# Patient Record
Sex: Male | Born: 1995 | Race: White | Hispanic: No | Marital: Single | Smoking: Never smoker
Health system: Southern US, Community
[De-identification: ages and names within clinical notes are randomized; demographics above are authoritative.]

---

## 2020-02-27 ENCOUNTER — Other Ambulatory Visit: Payer: Self-pay

## 2020-02-27 ENCOUNTER — Emergency Department
Admission: EM | Admit: 2020-02-27 | Discharge: 2020-02-27 | Disposition: A | Discharge status: Home / Self Care | Payer: Self-pay | Source: Home / Self Care | Attending: Family Medicine | Admitting: Family Medicine

## 2020-02-27 DIAGNOSIS — R591 Generalized enlarged lymph nodes: Secondary | ICD-10-CM

## 2020-02-27 DIAGNOSIS — R5383 Other fatigue: Secondary | ICD-10-CM

## 2020-02-27 LAB — POCT MONO SCREEN (KUC): Mono, POC: NEGATIVE

## 2020-02-27 NOTE — Discharge Instructions (Addendum)
Recommend a daily multiple vitamin, including vitamin D.  Try taking daily nutritional supplement such as Ensure

## 2020-02-27 NOTE — ED Provider Notes (Signed)
[facility]    CSN: [id number] Arrival date & time: 02/27/20  1253      History   Chief Complaint Chief Complaint  Patient presents with  . Fatigue    HPI Spencer Moon is a 24 y.o. male.   Patient reports that he had extensive dental work earlier this year, finishing his last antibiotics in February. Three weeks ago he noticed a tender lymph node on his left neck, followed by bilateral tender axillary nodes and inguinal nodes.  He has been persistently fatigued with headache, myalgias, and night sweats.  He has had occasional nausea/vomiting for two weeks and anorexia resulting in weight loss.  He complains of vague lower back ache without urinary symptoms and mild non-productive cough for one month.  The history is provided by the patient.    History reviewed. No pertinent past medical history.  There are no problems to display for this patient.   History reviewed. No pertinent surgical history.     Home Medications    Prior to Admission medications   Not on File    Family History Family History  Problem Relation Age of Onset  . Diabetes Father     Social History Social History   Tobacco Use  . Smoking status: Never Smoker  . Smokeless tobacco: Never Used  Substance Use Topics  . Alcohol use: Not Currently  . Drug use: Yes    Types: Marijuana     Allergies   Patient has no known allergies.   Review of Systems Review of Systems  Constitutional: Positive for activity change, appetite change, chills, diaphoresis, fatigue and unexpected weight change. Negative for fever.  HENT: Negative.   Eyes: Negative.   Respiratory: Positive for cough. Negative for chest tightness, shortness of breath and wheezing.   Cardiovascular: Negative.   Gastrointestinal: Positive for nausea and vomiting. Negative for abdominal distention, abdominal pain, blood in stool, constipation and diarrhea.  Genitourinary: Negative.   Musculoskeletal: Positive for  back pain. Negative for myalgias and neck pain.  Skin: Negative.   Neurological: Negative.   Hematological: Positive for adenopathy.     Physical Exam Triage Vital Signs ED Triage Vitals  Enc Vitals Group     BP 02/27/20 1316 115/82     Pulse Rate 02/27/20 1316 97     Resp 02/27/20 1316 16     Temp 02/27/20 1316 98.1 F (36.7 C)     Temp Source 02/27/20 1316 Oral     SpO2 02/27/20 1316 100 %     Weight --      Height --      Head Circumference --      Peak Flow --      Pain Score 02/27/20 1314 3     Pain Loc --      Pain Edu? --      Excl. in [place]? --    No data found.  Updated Vital Signs BP 115/82 (BP Location: Left Arm)   Pulse 97   Temp 98.1 F (36.7 C) (Oral)   Resp 16   SpO2 100%   Visual Acuity Right Eye Distance:   Left Eye Distance:   Bilateral Distance:    Right Eye Near:   Left Eye Near:    Bilateral Near:     Physical Exam Nursing notes and Vital Signs reviewed. Appearance:  Patient appears stated age, somewhat cachectic, and in no acute distress Eyes:  Pupils are equal, round, and reactive to light and accomodation.  Extraocular  movement is intact.  Conjunctivae are not inflamed  Ears:  Canals normal.  Tympanic membranes normal.  Nose:   Normal turbinates.  No sinus tenderness.   Pharynx:  Normal Mouth:  Tongue normal.  No lesions.  Evidence of recent dental repairs Neck:  Supple.  Bilateral tender shotty cervical nodes.  Bilateral tender supraclavicular nodes.  Lungs:  Clear to auscultation.  Breath sounds are equal.  Moving air well. Heart:  Regular rate and rhythm without murmurs, rubs, or gallops.  Abdomen:  Tenderness over spleen and mild tenderness over liver without masses or hepatosplenomegaly.  Bowel sounds are present.  No CVA or flank tenderness.  Extremities:  No edema.  Skin:  No rash present.  Lymph nodes:  Tender shotty inguinal nodes.  UC Treatments / Results  Labs (all labs ordered are listed, but only abnormal results are  displayed) Labs Reviewed  POCT MONO SCREEN [facility]) negative    EKG   Radiology No results found.  Procedures Procedures (including critical care time)  Medications Ordered in UC Medications - No data to display  Initial Impression / Assessment and Plan / UC Course  I have reviewed the triage vital signs and the nursing notes.  Pertinent labs & imaging results that were available during my care of the patient were reviewed by me and considered in my medical decision making (see chart for details).    Despite negative Monospot test, suspect EBV.  Patient prefers to defer other testing for now because he is self-pay. Treat symptomatically; recommend follow-up with PCP if not improving in about 3 weeks   Final Clinical Impressions(s) / UC Diagnoses   Final diagnoses:  Fatigue, unspecified type  Lymphadenopathy     Discharge Instructions     Recommend a daily multiple vitamin, including vitamin D.  Try taking daily nutritional supplement such as Ensure    ED Prescriptions    None        Kandra Nicolas, MD 02/29/20 1302

## 2020-02-27 NOTE — ED Triage Notes (Signed)
Patient presents to Urgent Care with complaints of generalized fatigue and body aches as well as a nonproductive cough since a month ago. Patient reports he has not been around other sick contacts, thinks he may have been exposed to mono at the dentist.

## 2020-03-05 ENCOUNTER — Other Ambulatory Visit: Payer: Self-pay

## 2020-03-05 ENCOUNTER — Emergency Department
Admission: EM | Admit: 2020-03-05 | Discharge: 2020-03-05 | Disposition: A | Discharge status: Home / Self Care | Payer: Self-pay | Source: Home / Self Care

## 2020-03-05 ENCOUNTER — Encounter: Payer: Self-pay | Admitting: *Deleted

## 2020-03-05 DIAGNOSIS — R591 Generalized enlarged lymph nodes: Secondary | ICD-10-CM

## 2020-03-05 DIAGNOSIS — M79602 Pain in left arm: Secondary | ICD-10-CM

## 2020-03-05 DIAGNOSIS — R5383 Other fatigue: Secondary | ICD-10-CM

## 2020-03-05 MED ORDER — PREDNISONE 50 MG PO TABS: 50.0000 mg | ORAL_TABLET | Freq: Every day | ORAL | 0 refills | Status: AC

## 2020-03-05 NOTE — ED Provider Notes (Signed)
[facility]    CSN: [id number] Arrival date & time: 03/05/20  0851      History   Chief Complaint Chief Complaint  Patient presents with  . Arm Pain    left    HPI Temitayo Haywood is a 24 y.o. male.   HPI Trystian Fant is a 24 y.o. male presenting to UC with c/o Left arm pain and lymphadenopathy. Pt was seen at [facility] 1 week ago for suspected mono. He had a negative rapid mono test but opted out of additional testing due to financial concerns and fear of needles.  Two days ago he developed Left arm soreness after he slept on it extended out "cutting off circulation."  Pain is burning at times, tender and sore, associated swollen axillary tender lymph nodes. He also reports fatigue. He has taken ibuprofen with mild temporary relief. His girlfriend has also started to develop similar symptoms. He thinks she may have mono too but she has not been tested.  He has not received the covid vaccine but his girlfriend received her first dose. Pt denies fever, chills, n/v/d. No cough or congestion.   Pt also reports two small itchy bumps on his chest that developed 2 days ago but he believes that is from acne.    History reviewed. No pertinent past medical history.  There are no problems to display for this patient.   History reviewed. No pertinent surgical history.     Home Medications    Prior to Admission medications   Medication Sig Start Date End Date Taking? Authorizing Provider  predniSONE (DELTASONE) 50 MG tablet Take 1 tablet (50 mg total) by mouth daily with breakfast for 5 days. 03/05/20 03/10/20  Lurene Shadow, PA-C    Family History Family History  Problem Relation Age of Onset  . Diabetes Father     Social History Social History   Tobacco Use  . Smoking status: Never Smoker  . Smokeless tobacco: Never Used  Substance Use Topics  . Alcohol use: Not Currently  . Drug use: Yes    Types: Marijuana     Allergies   Patient has no known  allergies.   Review of Systems Review of Systems  Constitutional: Positive for fatigue. Negative for chills and fever.  HENT: Negative for congestion, ear pain, sore throat, trouble swallowing and voice change.   Respiratory: Negative for cough and shortness of breath.   Cardiovascular: Negative for chest pain and palpitations.  Gastrointestinal: Negative for abdominal pain, diarrhea, nausea and vomiting.  Musculoskeletal: Positive for arthralgias, myalgias and neck pain (lymph nodes). Negative for back pain and neck stiffness.  Skin: Negative for rash.  Neurological: Positive for headaches. Negative for dizziness and light-headedness.  All other systems reviewed and are negative.    Physical Exam Triage Vital Signs ED Triage Vitals  Enc Vitals Group     BP 03/05/20 0902 138/81     Pulse Rate 03/05/20 0902 88     Resp 03/05/20 0902 16     Temp 03/05/20 0902 98 F (36.7 C)     Temp Source 03/05/20 0902 Oral     SpO2 03/05/20 0902 99 %     Weight 03/05/20 0903 132 lb (59.9 kg)     Height 03/05/20 0903 6' (1.829 m)     Head Circumference --      Peak Flow --      Pain Score 03/05/20 0903 4     Pain Loc --  Pain Edu? --      Excl. in [place]? --    No data found.  Updated Vital Signs BP 138/81 (BP Location: Right Arm)   Pulse 88   Temp 98 F (36.7 C) (Oral)   Resp 16   Ht 6' (1.829 m)   Wt 132 lb (59.9 kg)   SpO2 99%   BMI 17.90 kg/m   Visual Acuity Right Eye Distance:   Left Eye Distance:   Bilateral Distance:    Right Eye Near:   Left Eye Near:    Bilateral Near:     Physical Exam Vitals and nursing note reviewed.  Constitutional:      General: He is not in acute distress.    Appearance: Normal appearance. He is well-developed. He is not ill-appearing, toxic-appearing or diaphoretic.  HENT:     Head: Normocephalic and atraumatic.     Right Ear: Tympanic membrane and ear canal normal.     Left Ear: Tympanic membrane and ear canal normal.     Nose: Nose  normal.     Right Sinus: No maxillary sinus tenderness or frontal sinus tenderness.     Left Sinus: No maxillary sinus tenderness or frontal sinus tenderness.     Mouth/Throat:     Lips: Pink.     Mouth: Mucous membranes are moist.     Pharynx: Oropharynx is clear. Uvula midline.  Neck:     Vascular: No carotid bruit.  Cardiovascular:     Rate and Rhythm: Normal rate and regular rhythm.  Pulmonary:     Effort: Pulmonary effort is normal. No respiratory distress.     Breath sounds: Normal breath sounds. No stridor. No wheezing, rhonchi or rales.  Musculoskeletal:        General: Tenderness present. Normal range of motion.     Cervical back: Normal range of motion and neck supple. No rigidity or tenderness.     Comments: Left arm: full ROM, mild tenderness to upper arm. No bony tenderness. Tenderness in Left axilla, palpable lymph nodes.  Lymphadenopathy:     Cervical: Cervical adenopathy present.  Skin:    General: Skin is warm and dry.  Neurological:     Mental Status: He is alert and oriented to person, place, and time.  Psychiatric:        Behavior: Behavior normal.      UC Treatments / Results  Labs (all labs ordered are listed, but only abnormal results are displayed) Labs Reviewed - No data to display  EKG   Radiology No results found.  Procedures Procedures (including critical care time)  Medications Ordered in UC Medications - No data to display  Initial Impression / Assessment and Plan / UC Course  I have reviewed the triage vital signs and the nursing notes.  Pertinent labs & imaging results that were available during my care of the patient were reviewed by me and considered in my medical decision making (see chart for details).    Reviewed prior visit from 1 week ago Discussed getting blood work today Pt declined again due to fear of needles and financial concern.  Pt states he is trying to get his medicaid card so he can establish with a PCP Will  have pt try trial of prednisone to help with pain and swelling Encouraged fluids and rest Resource guide and AVS provided.  Final Clinical Impressions(s) / UC Diagnoses   Final diagnoses:  Left arm pain  Lymphadenopathy  Fatigue, unspecified type  Discharge Instructions      You may take 500mg  acetaminophen every 4-6 hours or in combination with ibuprofen 400-600mg  every 6-8 hours as needed for pain, inflammation, and fever.  Be sure to well hydrated with clear liquids and get at least 8 hours of sleep at night, preferably more while sick.   Please follow up with family medicine in 1 week if needed.     ED Prescriptions    Medication Sig Dispense Auth. Provider   predniSONE (DELTASONE) 50 MG tablet Take 1 tablet (50 mg total) by mouth daily with breakfast for 5 days. 5 tablet , PA-C     PDMP not reviewed this encounter.   Lurene Shadow, PA-C 03/05/20 1018

## 2020-03-05 NOTE — ED Triage Notes (Signed)
Patient was seen 1 week ago and presumed to have mono. 2 days ago started having left arm pain. Believes he slept where he had cut off the circulation. C/o burning, tenderness and soreness still. C/o small red itching bumps on chest x 2 days.

## 2020-03-05 NOTE — Discharge Instructions (Signed)
  You may take 500mg acetaminophen every 4-6 hours or in combination with ibuprofen 400-600mg every 6-8 hours as needed for pain, inflammation, and fever.  Be sure to well hydrated with clear liquids and get at least 8 hours of sleep at night, preferably more while sick.   Please follow up with family medicine in 1 week if needed.   

## 2020-03-15 ENCOUNTER — Emergency Department (INDEPENDENT_AMBULATORY_CARE_PROVIDER_SITE_OTHER): Payer: Self-pay

## 2020-03-15 ENCOUNTER — Other Ambulatory Visit: Payer: Self-pay

## 2020-03-15 ENCOUNTER — Emergency Department
Admission: EM | Admit: 2020-03-15 | Discharge: 2020-03-15 | Disposition: A | Discharge status: Home / Self Care | Payer: Self-pay | Source: Home / Self Care

## 2020-03-15 ENCOUNTER — Encounter: Payer: Self-pay | Admitting: Emergency Medicine

## 2020-03-15 DIAGNOSIS — R5383 Other fatigue: Secondary | ICD-10-CM

## 2020-03-15 DIAGNOSIS — R079 Chest pain, unspecified: Secondary | ICD-10-CM

## 2020-03-15 DIAGNOSIS — R590 Localized enlarged lymph nodes: Secondary | ICD-10-CM

## 2020-03-15 NOTE — ED Triage Notes (Signed)
Patient returns for lump on left side of neck that has not resolved; pain in left axilla; and variety of concerns including wondering if he has cancer. Very flat affect as he relates numerous problems. Feels he had covid 12 months ago but it was not diagnosed; many problems since that time.

## 2020-03-15 NOTE — ED Provider Notes (Signed)
[facility]    CSN: [id number] Arrival date & time: 03/15/20  1145      History   Chief Complaint Chief Complaint  Patient presents with  . Mass    HPI Spencer Moon is a 24 y.o. male.   HPI Spencer Moon is a 24 y.o. male presenting to UC with c/o continued enlarged lymph nodes in his neck and Left axilla. Associated pain and fatigue.  He has still not been able to find a PCP due to lack of insurance.  [facility] with similar symptoms. A MonoSpot was performed, it was negative. He was seen again on 03/05/20, additional testing was offered, pt declined due to fear of needles. He reports vomiting after having the monospot performed in the UC. He also reports becoming combative in the past when he has had blood drawn due to a traumatic IV insertion when he was admitted for an abdominal exam for acid reflux.   Pt is hoping to get a CXR to see if that can give any information about his pain and enlarged lymph nodes.  Denies fever, chills, n/v/d.    History reviewed. No pertinent past medical history.  There are no problems to display for this patient.   History reviewed. No pertinent surgical history.     Home Medications    Prior to Admission medications   Not on File    Family History Family History  Problem Relation Age of Onset  . Diabetes Father     Social History Social History   Tobacco Use  . Smoking status: Never Smoker  . Smokeless tobacco: Never Used  Substance Use Topics  . Alcohol use: Not Currently  . Drug use: Yes    Types: Marijuana     Allergies   Patient has no known allergies.   Review of Systems Review of Systems  Constitutional: Positive for fatigue. Negative for chills and fever.  HENT: Negative for congestion, ear pain, sore throat, trouble swallowing and voice change.   Respiratory: Negative for cough and shortness of breath.   Cardiovascular: Negative for chest pain and palpitations.    Gastrointestinal: Negative for abdominal pain, diarrhea, nausea and vomiting.  Musculoskeletal: Positive for arthralgias and myalgias. Negative for back pain.  Skin: Negative for color change and rash.  All other systems reviewed and are negative.    Physical Exam Triage Vital Signs ED Triage Vitals  Enc Vitals Group     BP 03/15/20 1213 123/89     Pulse Rate 03/15/20 1213 (!) 104     Resp 03/15/20 1213 16     Temp 03/15/20 1213 98.2 F (36.8 C)     Temp Source 03/15/20 1213 Oral     SpO2 03/15/20 1213 100 %     Weight --      Height 03/15/20 1215 6' (1.829 m)     Head Circumference --      Peak Flow --      Pain Score 03/15/20 1215 2     Pain Loc --      Pain Edu? --      Excl. in GC? --    No data found.  Updated Vital Signs BP 123/89   Pulse (!) 104   Temp 98.2 F (36.8 C) (Oral)   Resp 16   Ht 6' (1.829 m)   SpO2 100%   BMI 17.90 kg/m   Visual Acuity Right Eye Distance:   Left Eye Distance:  Bilateral Distance:    Right Eye Near:   Left Eye Near:    Bilateral Near:     Physical Exam Vitals and nursing note reviewed.  Constitutional:      General: He is not in acute distress.    Appearance: Normal appearance. He is well-developed. He is not ill-appearing, toxic-appearing or diaphoretic.  HENT:     Head: Normocephalic and atraumatic.     Right Ear: Tympanic membrane and ear canal normal.     Left Ear: Tympanic membrane and ear canal normal.     Nose: Nose normal.     Right Sinus: No maxillary sinus tenderness or frontal sinus tenderness.     Left Sinus: No maxillary sinus tenderness or frontal sinus tenderness.     Mouth/Throat:     Lips: Pink.     Mouth: Mucous membranes are moist.     Pharynx: Oropharynx is clear. Uvula midline.  Eyes:     Extraocular Movements: Extraocular movements intact.  Neck:      Comments: Left cervical and supraclavicular lymphadenopathy  Cardiovascular:     Rate and Rhythm: Normal rate and regular rhythm.   Pulmonary:     Effort: Pulmonary effort is normal. No respiratory distress.     Breath sounds: Normal breath sounds. No stridor. No wheezing, rhonchi or rales.  Chest:     Chest wall: No tenderness.  Musculoskeletal:        General: Normal range of motion.     Cervical back: Normal range of motion and neck supple.  Lymphadenopathy:     Cervical: Cervical adenopathy present.  Skin:    General: Skin is warm and dry.  Neurological:     Mental Status: He is alert and oriented to person, place, and time.  Psychiatric:        Behavior: Behavior normal.      UC Treatments / Results  Labs (all labs ordered are listed, but only abnormal results are displayed) Labs Reviewed - No data to display  EKG   Radiology DG Chest 2 View  Result Date: 03/15/2020 CLINICAL DATA:  LEFT axillary lymphadenopathy and LEFT subclavicular lymphadenopathy. Chest, back and axillary pain for the past month, progressively worsening. No injury. EXAM: CHEST - 2 VIEW COMPARISON:  None. FINDINGS: Heart size and mediastinal contours are within normal limits. Lungs are clear. No pleural effusion or pneumothorax is seen. Osseous structures about the chest are unremarkable. IMPRESSION: Normal chest x-ray. Electronically Signed   By: Bary Richard M.D.   On: 03/15/2020 13:05    Procedures Procedures (including critical care time)  Medications Ordered in UC Medications - No data to display  Initial Impression / Assessment and Plan / UC Course  I have reviewed the triage vital signs and the nursing notes.  Pertinent labs & imaging results that were available during my care of the patient were reviewed by me and considered in my medical decision making (see chart for details).     Discussed CXR with pt Strongly encouraged to allow blood work be taken today to help determine cause of his symptoms.  After lengthy discussion, pt declined. He plans to use resource guide to help find a PCP and hopes to find one  that can sedate him for lab work to be drawn.  Recruitment consultant provided pt  AVS provided  Final Clinical Impressions(s) / UC Diagnoses   Final diagnoses:  Supraclavicular lymphadenopathy  Fatigue, unspecified type     Discharge Instructions  Emergency Department Resource Guide 1) Find a Doctor and Pay Out of Pocket Although you won't have to find out who is covered by your insurance plan, it is a good idea to ask around and get recommendations. You will then need to call the office and see if the doctor you have chosen will accept you as a new patient and what types of options they offer for patients who are self-pay. Some doctors offer discounts or will set up payment plans for their patients who do not have insurance, but you will need to ask so you aren't surprised when you get to your appointment.  2) Contact Your Local Health Department Not all health departments have doctors that can see patients for sick visits, but many do, so it is worth a call to see if yours does. If you don't know where your local health department is, you can check in your phone book. The CDC also has a tool to help you locate your state's health department, and many state websites also have listings of all of their local health departments.  3) Find a Walk-in Clinic If your illness is not likely to be very severe or complicated, you may want to try a walk in clinic. These are popping up all over the country in pharmacies, drugstores, and shopping centers. They're usually staffed by nurse practitioners or physician assistants that have been trained to treat common illnesses and complaints. They're usually fairly quick and inexpensive. However, if you have serious medical issues or chronic medical problems, these are probably not your best option.  No Primary Care Doctor: - Call Health Connect at  [phone] - they can help you locate a primary care doctor that  accepts your insurance, provides  certain services, etc. - Physician Referral Service- [phone]  Chronic Pain Problems: Organization         Address  Phone   Notes  [facility]  [phone] Patients need to be referred by their primary care doctor.   Medication Assistance: Organization         Address  Phone   Notes  [facility] [address] [place] [place], [place] [address] [phone] --Must be a resident of [facility] -- Must have NO insurance coverage whatsoever (no Medicaid/ Medicare, etc.) -- The pt. MUST have a primary care doctor that directs their care regularly and follows them in the community   [facility]ist  [phone]   [employer]  [phone]    Agencies that provide inexpensive medical care: Organization         Address                                                       Phone                                                                            Notes  [facility]  [phone]   [facility]    [phone])  [phone]   [facility] [address] [place], [place] [address] [phone]   [facility] [address] [place] [place] [phone]   Planned Parenthood    [phone]   [facility]    [phone]   Community Health and [facility]  [address], [place] Phone:  [phone], Fax:  [phone] Hours of Operation:  9 am - 6 pm, M-F.  Also accepts Medicaid/Medicare and self-pay.  [facility]  [address], [place] Phone: [phone], Fax: [phone]. Hours of Operation:  8:30 am - 5:30 pm, M-F.  Also accepts Medicaid and self-pay.  [facility] [place] [address], [place] Phone: [phone]   [facility] [address] Natasha Bence [place], [place] [phone], Ext. [phone] Mondays & Thursdays: 7-9 AM.  First 15 patients are seen on a first come,  first serve basis.    Medicaid-accepting [facility] Providers:  Organization         Address                                                                       Phone                               Notes  [facility] [address],  [phone] Also accepts self-pay patients.  [facility] [address] Laurell Josephs [place], [place]  [phone]   [facility] [address], [place] [phone]   [facility] [address] [place] [phone]   Renaye Rakers [address], [place]   [phone] Only accepts [place] Access [place] patients after they have their name applied to their card.   Self-Pay (no insurance) in [facility]:   Organization         Address                                                     Phone               Notes  Sickle Cell Patients, [facility] Medicine [address] [place], [place] [phone]   [facility] [address] [place], [place] [phone]   [facility] [place]  [address], [place] [phone]   [facility]/Dr. Osei-Bonsu  [address], [place] or [address], [place] [phone] Phone number for both [place] and [place] locations is the same.  [facility]gent Medical and [facility] [place] [facility] [address], [place] [phone]   [facility] [address], [place] or [address] [phone]   [facility] [address] [place], [place] [phone], phone; [phone], fax Sees patients 1st and 3rd Saturday of  every month.  Must not qualify for public or private insurance (i.e. Medicaid, Medicare, [place] Health Choice, Veterans' Benefits) . Household income should be no more than 200% of the poverty level .The clinic cannot treat you if you  are pregnant or think you are pregnant . Sexually transmitted diseases are not treated at the clinic.    Dental Care: Organization         Address                                  Phone                       Notes  [facility] Department of [facility] [place] [phone] Accepts children up to age 45 who are enrolled in [place] or [place]; pregnant women with a Medicaid card; and children who have applied for Medicaid or [place] Health Choice, but were declined, whose parents can pay a reduced fee at time of service.  [facility] Department of [facility]  [address], [place] [phone] Accepts children up to age 50 who are enrolled in [place] or [place]; pregnant women with a Medicaid card; and children who have applied for Medicaid or [place] Health Choice, but were declined, whose parents can pay a reduced fee at time of service.  [place] [facility]  [place] [phone] Patients are seen by appointment only. Walk-ins are not accepted. [place] will see patients 29 years of age and older. Monday - Tuesday (8am-5pm) Most Wednesdays (8:30-5pm) $30 per visit, cash only  [facility]  [address], [facility] [phone] Patients are seen by appointment only. Walk-ins are not accepted. [place] will see patients 17 years of age and older. One Wednesday Evening (Monthly: Volunteer Based).  $30 per visit, cash only  [place]  [phone] for adults; Children under age 49, call Graduate Pediatric Dentistry at [phone]. Children aged 72-14, please call [phone] to request a pediatric application.  Dental services are provided in all areas of dental care including fillings, crowns and bridges, complete and partial dentures, implants, gum treatment, root canals, and extractions.  Preventive care is also provided. Treatment is provided to both adults and children. Patients are selected via a lottery and there is often a waiting list.   [facility] [address] [place]  [phone] [email or web address]   [facility] [address] [place], [place] [phone], Ext. [phone] Second and Fourth Thursday of each month, opens at 6:30 AM; Clinic ends at 9 AM.  Patients are seen on a first-come first-served basis, and a limited number are seen during each clinic.   [facility]  [address] Hillard Danker [place], [place] [phone]   Eligibility Requirements You must have lived in [place], [place], or [place] counties for at least the last three months.   You cannot be eligible for state or federal sponsored [employer], including [employer], [place], or [employer].   You generally cannot be eligible for healthcare insurance through your employer.    How to apply: Eligibility screenings are held every Tuesday and Wednesday afternoon from 1:00 pm until 4:00 pm. You do not need an appointment for the interview!  [facility] [address] [place], [place] [phone]   [facility] Health Department  [phone]   [facility] Health Department  [phone]   [facility] Health Department  [phone]    Behavioral Health Resources in the Community: Intensive Outpatient Programs Organization         Address                                              Phone              Notes  [facility] [address], [place], [place] [phone]   [facility] Outpatient [address], [place], [place] [phone]   [facility] [address] [place], [place]  [phone]   [facility] Mental Health [address],  [place], [place] [phone] or [phone]   Substance Abuse Resources Organization         Address                                 Phone  Notes  [facility] Drug Services  [phone]   [facility]  [phone]   [employer]  [phone]   Floydene Flock  [phone]   Residential & Outpatient Substance Abuse Program  [phone]   Psychological Services Organization         Address                                  Phone                Notes  [facility] Behavioral Health  [phone]   [facility]  [phone]   [facility] [address], [place] [phone] or [phone]    Mobile Crisis Teams Organization         Address  Phone  Notes  [facility], Mobile Crisis Care Unit  [phone]   [facility]  [address] [place], [place] [phone]   Doristine Locks [address] [place] [phone]    Self-Help/Support Groups Organization         Address                         Phone             Notes  [facility] [place] - variety of support groups  336- I7437963 Call for more information  Narcotics Anonymous (NA), [facility] [address], [employer] [place]  2 meetings at this location   Statistician         Address                                                    Phone              Notes  [facility] [address],    [place]  [phone]   [facility]  [address], [place] [address],  [place], [place] [phone]   [facility] [address] [place], [place] [phone] Admissions: 8am-3pm M-F  [facility] [address],    [place], [place] [phone]   [facility] [address] [place], [place], [place] [phone]   [facility] [address],  [place], [place] [phone]   [facility] - Intensive Outpatient [address] Laure[address] Joseph[address], [place], [place] [phone]   [facility] ([facility] [address]  [place].,  [place], [place] [phone] or [phone]   [facility] ([facility]) [address], [place], [place] [phone] Accepts Medicaid  [facility] [place] [address],  [place] [phone] Substance Abuse/Addiction Treatment   [facility] Organization         Address                                                            Phone                    Notes  [facility]  [phone]   Angie Fava, PhD [address] Ervin Knack [place], [place]   [phone] or [phone]   [facility]   [address] [place], [place] [phone]   [facility] [address] [place], [place] [phone] Insurance/Medicaid/sponsorship through [facility] and Families [address]                                    [place], [place] [phone] Therapy/tele-psych/case  [facility] [address], [place] [phone]    Dr. Lolly Mustache  [phone]   [facility]  [employer] [facility] Dept. 1) [address] [place] 2) [address], [place] 3)  [address], [place] [phone]  [phone]   [facility] Child Abuse Hotline 726-415-6601 or [phone] (After Hours)          ED Prescriptions    None     PDMP not reviewed this encounter.   Lurene Shadow, [place] 03/16/20 1429

## 2020-03-15 NOTE — Discharge Instructions (Signed)
°Emergency Department Resource Guide °1) Find a Doctor and Pay Out of Pocket °Although you won't have to find out who is covered by your insurance plan, it is a good idea to ask around and get recommendations. You will then need to call the office and see if the doctor you have chosen will accept you as a new patient and what types of options they offer for patients who are self-pay. Some doctors offer discounts or will set up payment plans for their patients who do not have insurance, but you will need to ask so you aren't surprised when you get to your appointment. ° °2) Contact Your Local Health Department °Not all health departments have doctors that can see patients for sick visits, but many do, so it is worth a call to see if yours does. If you don't know where your local health department is, you can check in your phone book. The CDC also has a tool to help you locate your state's health department, and many state websites also have listings of all of their local health departments. ° °3) Find a Walk-in Clinic °If your illness is not likely to be very severe or complicated, you may want to try a walk in clinic. These are popping up all over the country in pharmacies, drugstores, and shopping centers. They're usually staffed by nurse practitioners or physician assistants that have been trained to treat common illnesses and complaints. They're usually fairly quick and inexpensive. However, if you have serious medical issues or chronic medical problems, these are probably not your best option. ° °No Primary Care Doctor: °- Call [facility] at  832-8000 - they can help you locate a primary care doctor that  accepts your insurance, provides certain services, etc. °- Physician Referral Service- 1-800-533-3463 ° °Chronic Pain Problems: °Organization         Address  Phone   Notes  °[facility]  (336) 297-2271 Patients need to be referred by their primary care doctor.  ° °Medication  Assistance: °Organization         Address  Phone   Notes  °[place] [facility] [address] °[place], [place] [address] (336) 641-8030 --Must be a resident of [place] °-- Must have NO insurance coverage whatsoever (no Medicaid/ Medicare, etc.) °-- The pt. MUST have a primary care doctor that directs their care regularly and follows them in the community °  °[facility]  (866) 331-1348   °[employer]  (888) 892-1162   ° °Agencies that provide inexpensive medical care: °Organization         Address  Phone   Notes  °[facility]  (336) 832-8035   °[facility]    (336) 832-7272   °[facility] [address] °[place], [place] [address] (336) 832-4777   °[facility] [address], °[place] (336) 271-4999   °[facility]    (336) 373-0678   °[facility]    (336) 272-1050   °[facility] ° [address], [place] Phone:  (336) 832-4444, Fax:  (336) 832-4440 Hours of Operation:  9 am - 6 pm, M-F.  Also accepts Medicaid/Medicare and self-pay.  °[facility] ° [address],  Phone: (336) 832-3150, Fax: (336) 832-3151. Hours of Operation:  8:30 am - 5:30 pm, M-F.  Also accepts Medicaid and self-pay.  °[facility] [place] [address], [place] Phone: (336) 878-6027   °[facility] [address], [place], [place] (336)723-1848, Ext. 123 Mondays & Thursdays: 7-9 AM.  First 15 patients are seen on a first come, first serve basis. °  ° °Medicaid-accepting [place] Providers: ° °Organization         Address  Phone   Notes  °[facility] [address], [place] (336) 641-2100 Also accepts self-pay patients.  °[facility] [address], [place] ° (336) 856-9996   °[facility] [address], [place]  (336) 288-8857   °[facility] [address] [place] [address], [place] (336) 299-7000   °Veita Bland [address], [place]  ° (336) 373-1557 Only accepts [place] Access Medicaid patients after they have their name applied to their card.  ° °Self-Pay (no insurance) in [place]: ° °Organization         Address  Phone   Notes  °Sickle Cell Patients, [facility] [address], [place] (336) 832-1970   °[facility] [address], [place] (336) 832-4400   ° [facility] [place] ° [address], [place] (336) 992-4800   °[facility]/Dr. Osei-Bonsu ° [address] [place] [address], [place] or [address], [place] (336) 841-8500 Phone number for both [place] and [place] locations is the same.  °[facility] [address], [place] (336) 299-0000   °[facility] [address] [place] [address], [place] or [address] (336) 852-7530 °(336) 878-2260   °[facility] [address], [place] (336) 350-1642, phone; (336) 294-5005, fax Sees patients 1st and 3rd Saturday of every month.  Must not qualify for public or private insurance (i.e. Medicaid, Medicare, [place] Health Choice, Veterans' Benefits) • Household income should be no more than 200% of the poverty level •The clinic cannot treat you if you are pregnant or think you are pregnant • Sexually transmitted diseases are not treated at the clinic.  ° ° °Dental Care: °Organization         Address  Phone  Notes  °[place] [facility] [address], [place] (336) 641-6152 Accepts children up to age 21 who are enrolled in Medicaid or [place] Health Choice; pregnant women with a Medicaid card; and children who have applied for Medicaid or [place] Health Choice, but were declined, whose parents can pay a reduced fee at time of service.  °[place]  [facility] [place]  [address], [place] (336) 641-7733 Accepts children up to age 21 who are enrolled in Medicaid or [place] Health Choice; pregnant women with a Medicaid card; and children who have applied for Medicaid or [place] Health Choice, but were declined, whose parents can pay a reduced fee at time of service.  °[facility] ° [address],  (336) 641-4533 Patients are seen by appointment only. Walk-ins are not accepted. [facility] will see patients 18 years of age and older. °Monday - Tuesday (8am-5pm) °Most Wednesdays (8:30-5pm) °$30 per visit, cash only  °[facility] ° [address], [place] (336) 641-4533 Patients are seen by appointment only. Walk-ins are not accepted. [facility] will see patients 18 years of age and older. °One   Wednesday Evening (Monthly: Volunteer Based).  $30 per visit, cash only  °[facility]  (919) 537-3737 for adults; Children under age 4, call Graduate Pediatric Dentistry at ([phone]) [phone]. Children aged 4-14, please call ([phone]) [phone] to request a pediatric application. ° Dental services are provided in all areas of dental care including fillings, crowns and bridges, complete and partial dentures, implants, gum treatment, root canals, and extractions. Preventive care is also provided. Treatment is provided to both adults and children. °Patients are selected via a lottery and there is often a waiting list. °  °[facility] [address], °[place] ° (336) 763-8833 [email or web address] °  °[facility] [address], [place], [place] (336)723-1848, Ext. 123 Second and Fourth Thursday of each month, opens at 6:30 AM; Clinic ends at 9 AM.  Patients are seen on a first-come first-served basis, and a limited number are seen during each clinic.  ° °[facility] ° [address], [place], [place] (336) 723-7904    Eligibility Requirements °You must have lived in [place], [place], or [place] counties for at least the last three months. °  You cannot be eligible for state or federal sponsored healthcare insurance, including Veterans Administration, Medicaid, or Medicare. °  You generally cannot be eligible for healthcare insurance through your employer.  °  How to apply: °Eligibility screenings are held every Tuesday and Wednesday afternoon from 1:00 pm until 4:00 pm. You do not need an appointment for the interview!  °[facility] [address], [place], [place] 336-631-2330   °[facility]  336-342-8273   °[facility]  336-703-3100   °[facility]  336-570-6415   ° °Behavioral Health Resources in the Community: °Intensive Outpatient Programs °Organization         Address  Phone  Notes  °[facility] [address], [place], [place] 336-878-6098   °[facility] [address], [place], [place] 336-832-9800   °[facility] [address], [place], [place] ° 336-882-2125   °[facility] [address],  °[place], [place] 1-800-853-5163 or 336-641-4981   °Substance Abuse Resources °Organization         Address  Phone  Notes  °[facility]  336-882-2125   °[facility]  336-784-9470   °[facility]  336-285-9073   °[facility]  336-845-3988   °Residential & Outpatient Substance Abuse Program  1-800-659-3381   °Psychological Services °Organization         Address  Phone  Notes  °[facility]  336- 832-9600   °[facility]  336- 378-7881   °[facility] [address], [place] 1-800-853-5163 or 336-641-4981   ° °Mobile Crisis Teams °Organization         Address  Phone  Notes  °[facility], Mobile Crisis Care Unit  1-877-626-1772   °Assertive °[facility] ° [address]  [place], [place] 336-834-9664   °Sharon DeEsch [address] °[place] 336-554-5454   ° °Self-Help/Support Groups °Organization         Address  Phone             Notes  °[facility] - variety of support groups  336- 373-1402 Call for more information  °Narcotics Anonymous (NA), [facility] [address], °[place]   2 meetings at this location  ° °  Residential Treatment Programs °Organization         Address  Phone  Notes  °[facility] [address],    °[place]  1-866-801-8205   °[facility] ° [address], [place], [place] 704-293-8524   °[facility] [address], [place] 336-845-3988 Admissions: 8am-3pm M-F  °[facility] [address]    °[place], [place] 336-841-1104   °[facility] [address], [place], [place] 336-379-7146   °[facility] [address]  °[place], [place] 336-285-9073   °[facility] - Intensive Outpatient [address], [place], [place] 336-852-3033   °[facility] [address]  °[place], [place] 1-877-615-2722 or 336-784-9470   °[facility] [address] [place], [place] 336-227-7417 Accepts Medicaid  °[facility] [address]  °[place] 1-800-659-3381 Substance Abuse/Addiction Treatment  ° °[place] Behavioral Health Resources °Organization         Address  Phone  Notes  °[facility]  (888) 581-9988   °Julie Brannon, PhD [address] [place], [place]   (336) 349-5553 or (336) 951-0000   °[facility]   [address] °[place], [place] (336) 349-4454   °[facility] [address], [place], [place] (336) 342-8316 Insurance/Medicaid/sponsorship through [facility]  °[facility] [address]                                    [place], [place] (336) 342-8316 Therapy/tele-psych/case    °[facility] [address]  ° [place], [place] (336) 349-2233    °Dr. Arfeen  (336) 349-4544   °[facility] [place]  [employer] [place] [facility] 1) [address], [place] °2) [address], [place] °3)  [address], [place] (336) 349-3220 °(336) 342-7768 ° °(336) 342-8140   °[place] [facility] (336) 342-1394 or (336) 342-3537 (After Hours)    ° ° °

## 2021-02-08 IMAGING — DX DG CHEST 2V
2 series · 2 of 2 positions shown · non-contrast
Comparison: None.

CLINICAL DATA: LEFT axillary lymphadenopathy and LEFT subclavicular
lymphadenopathy. Chest, back and axillary pain for the past month,
progressively worsening. No injury.

EXAM:
CHEST - 2 VIEW

[chest pa]
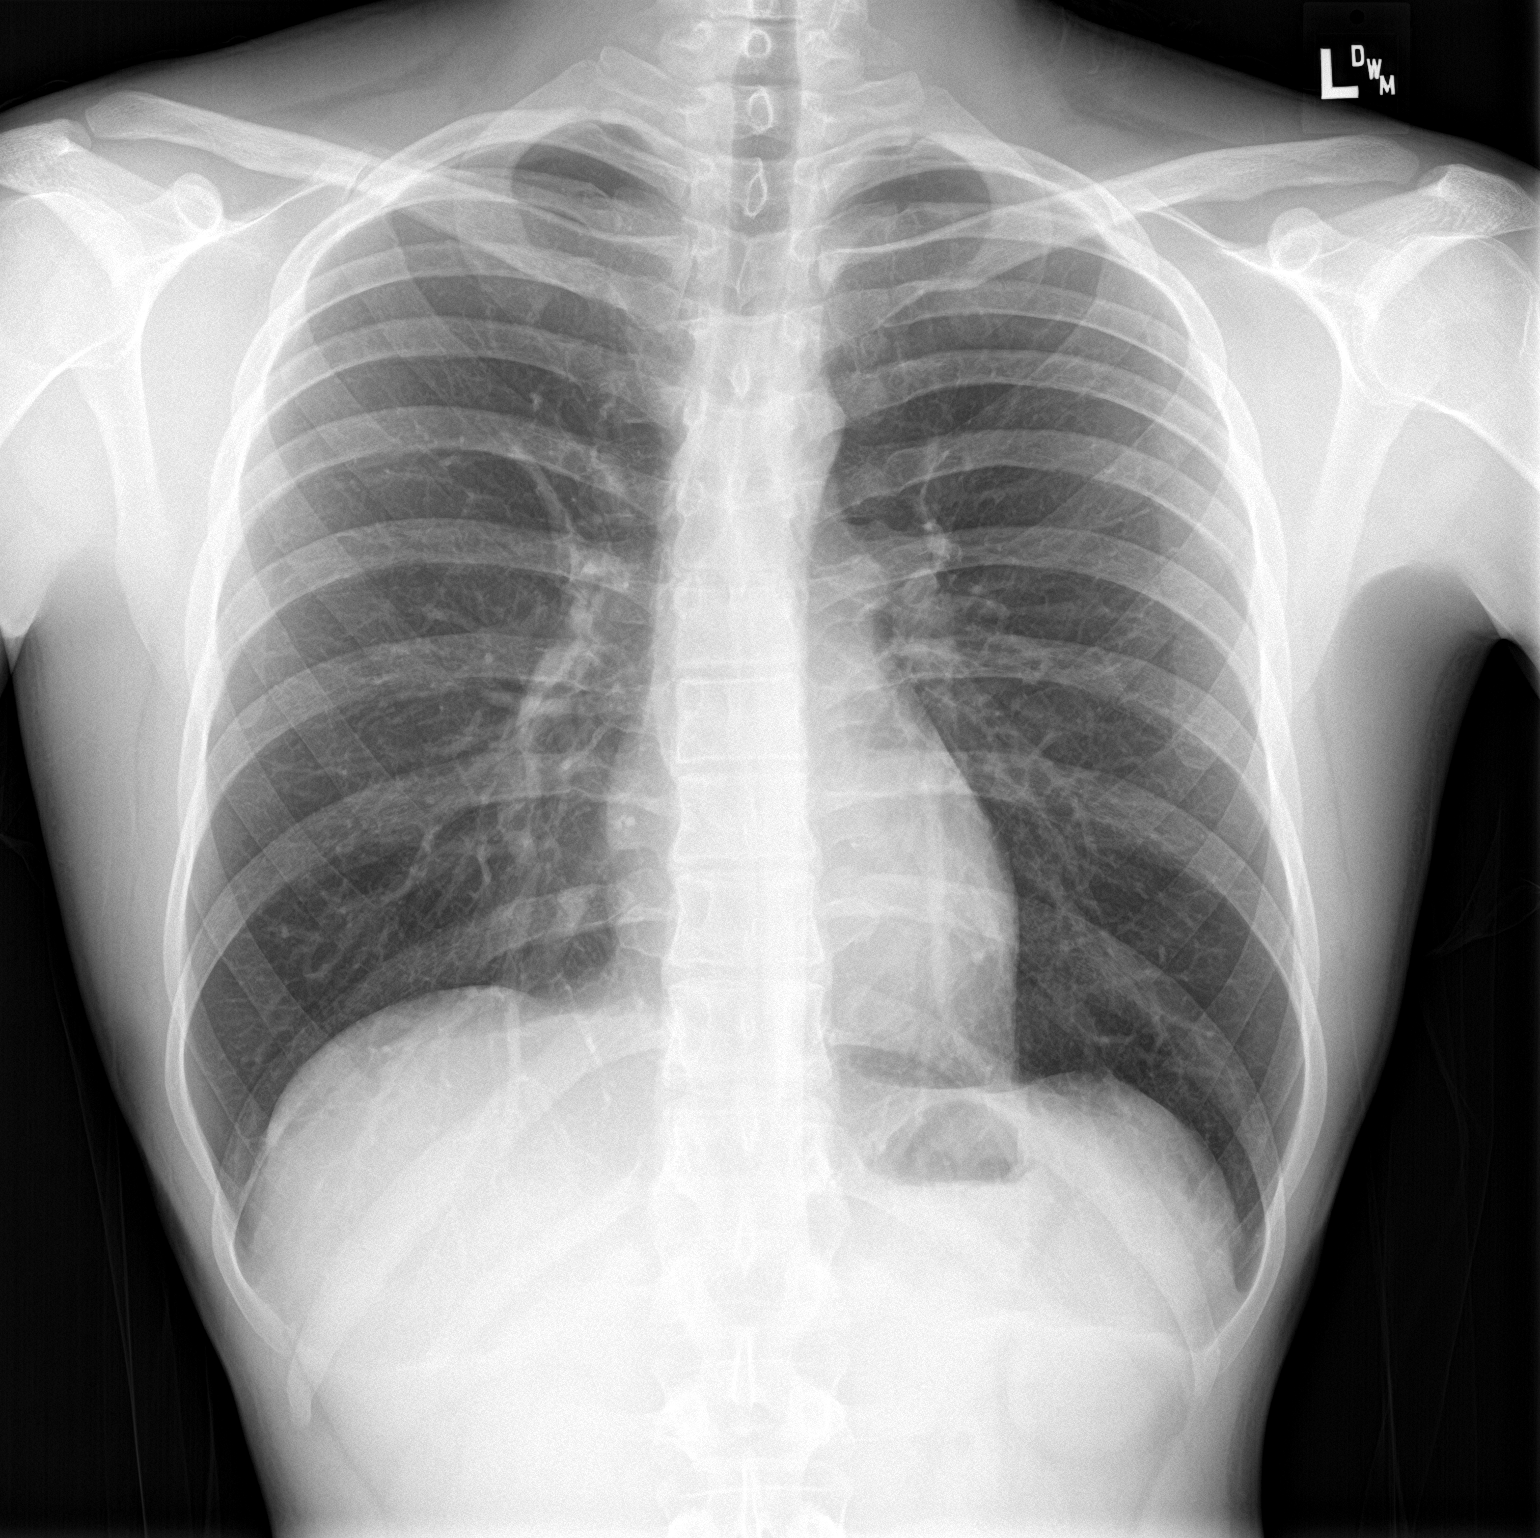

[chest lat]
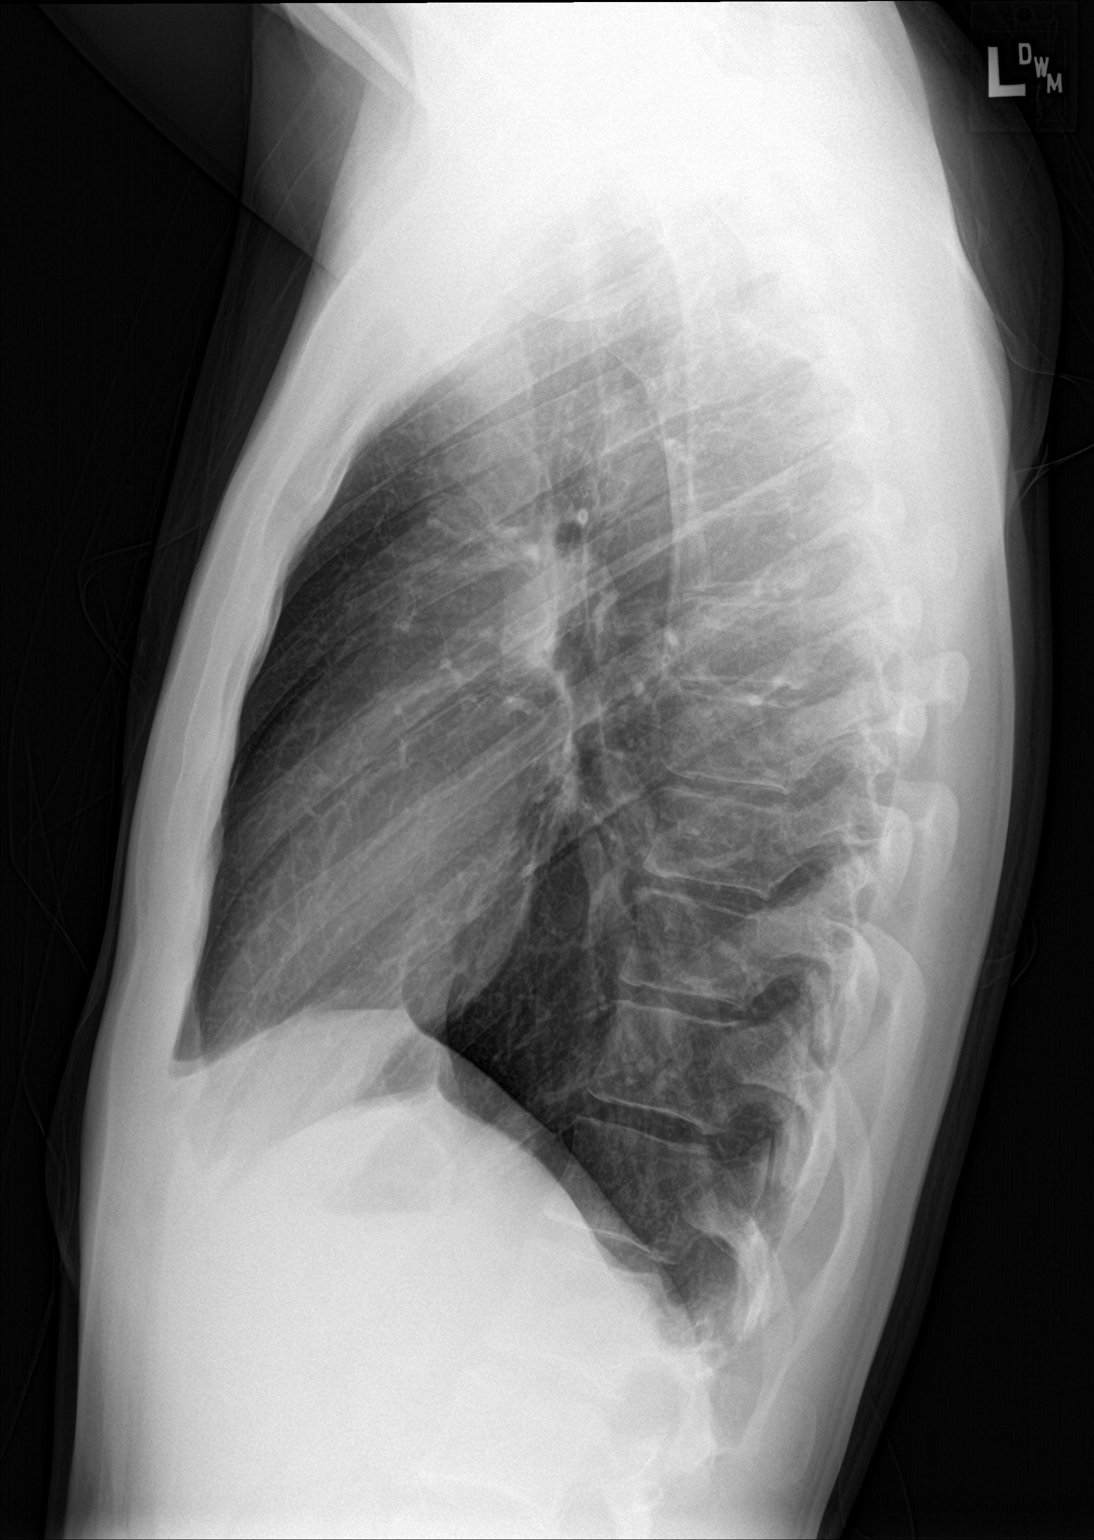

[2 of 2 positions shown; findings below may reference images not displayed]

FINDINGS: Heart size and mediastinal contours are within normal limits. Lungs
are clear. No pleural effusion or pneumothorax is seen. Osseous
structures about the chest are unremarkable.
IMPRESSION: Normal chest x-ray.
